# Patient Record
Sex: Male | Born: 1955 | Race: White | Hispanic: No | Marital: Married | Smoking: Former smoker
Health system: Southern US, Community
[De-identification: ages and names within clinical notes are randomized; demographics above are authoritative.]

## PROBLEM LIST (undated history)

## (undated) DIAGNOSIS — I4891 Unspecified atrial fibrillation: Secondary | ICD-10-CM

## (undated) HISTORY — PX: APPENDECTOMY: SHX54

## (undated) HISTORY — PX: CHOLECYSTECTOMY: SHX55

---

## 2019-02-10 ENCOUNTER — Encounter (HOSPITAL_BASED_OUTPATIENT_CLINIC_OR_DEPARTMENT_OTHER): Payer: Self-pay | Admitting: *Deleted

## 2019-02-10 ENCOUNTER — Emergency Department (HOSPITAL_BASED_OUTPATIENT_CLINIC_OR_DEPARTMENT_OTHER): Payer: 59

## 2019-02-10 ENCOUNTER — Emergency Department (HOSPITAL_BASED_OUTPATIENT_CLINIC_OR_DEPARTMENT_OTHER)
Admission: EM | Admit: 2019-02-10 | Discharge: 2019-02-10 | Disposition: A | Discharge status: Home / Self Care | Payer: 59 | Attending: Emergency Medicine | Admitting: Emergency Medicine

## 2019-02-10 ENCOUNTER — Other Ambulatory Visit: Payer: Self-pay

## 2019-02-10 DIAGNOSIS — Z87891 Personal history of nicotine dependence: Secondary | ICD-10-CM | POA: Diagnosis not present

## 2019-02-10 DIAGNOSIS — N201 Calculus of ureter: Secondary | ICD-10-CM | POA: Insufficient documentation

## 2019-02-10 DIAGNOSIS — Z7982 Long term (current) use of aspirin: Secondary | ICD-10-CM | POA: Diagnosis not present

## 2019-02-10 DIAGNOSIS — Z79899 Other long term (current) drug therapy: Secondary | ICD-10-CM | POA: Insufficient documentation

## 2019-02-10 DIAGNOSIS — R1030 Lower abdominal pain, unspecified: Secondary | ICD-10-CM | POA: Diagnosis present

## 2019-02-10 HISTORY — DX: Unspecified atrial fibrillation: I48.91

## 2019-02-10 LAB — LIPASE, BLOOD: Lipase: 28 U/L (ref 11–51)

## 2019-02-10 LAB — CBC WITH DIFFERENTIAL/PLATELET
Abs Immature Granulocytes: 0.03 10*3/uL (ref 0.00–0.07)
Basophils Absolute: 0 10*3/uL (ref 0.0–0.1)
Basophils Relative: 0 %
Eosinophils Absolute: 0 10*3/uL (ref 0.0–0.5)
Eosinophils Relative: 0 %
HCT: 45.2 % (ref 39.0–52.0)
Hemoglobin: 15 g/dL (ref 13.0–17.0)
Immature Granulocytes: 0 %
Lymphocytes Relative: 9 %
Lymphs Abs: 1 10*3/uL (ref 0.7–4.0)
MCH: 30.4 pg (ref 26.0–34.0)
MCHC: 33.2 g/dL (ref 30.0–36.0)
MCV: 91.7 fL (ref 80.0–100.0)
Monocytes Absolute: 0.4 10*3/uL (ref 0.1–1.0)
Monocytes Relative: 3 %
Neutro Abs: 9.5 10*3/uL — ABNORMAL HIGH (ref 1.7–7.7)
Neutrophils Relative %: 88 %
Platelets: 218 10*3/uL (ref 150–400)
RBC: 4.93 MIL/uL (ref 4.22–5.81)
RDW: 12.3 % (ref 11.5–15.5)
WBC: 10.9 10*3/uL — ABNORMAL HIGH (ref 4.0–10.5)
nRBC: 0 % (ref 0.0–0.2)

## 2019-02-10 LAB — COMPREHENSIVE METABOLIC PANEL
ALT: 29 U/L (ref 0–44)
AST: 26 U/L (ref 15–41)
Albumin: 4.5 g/dL (ref 3.5–5.0)
Alkaline Phosphatase: 71 U/L (ref 38–126)
Anion gap: 9 (ref 5–15)
BUN: 25 mg/dL — ABNORMAL HIGH (ref 8–23)
CO2: 25 mmol/L (ref 22–32)
Calcium: 11.3 mg/dL — ABNORMAL HIGH (ref 8.9–10.3)
Chloride: 104 mmol/L (ref 98–111)
Creatinine, Ser: 1.1 mg/dL (ref 0.61–1.24)
GFR calc Af Amer: >60 mL/min (ref >60)
GFR calc non Af Amer: >60 mL/min (ref >60)
Glucose, Bld: 110 mg/dL — ABNORMAL HIGH (ref 70–99)
Potassium: 4.4 mmol/L (ref 3.5–5.1)
Sodium: 138 mmol/L (ref 135–145)
Total Bilirubin: 1 mg/dL (ref 0.3–1.2)
Total Protein: 7.5 g/dL (ref 6.5–8.1)

## 2019-02-10 LAB — URINALYSIS, MICROSCOPIC (REFLEX): RBC / HPF: >50 RBC/hpf (ref 0–5)

## 2019-02-10 LAB — URINALYSIS, ROUTINE W REFLEX MICROSCOPIC
Bilirubin Urine: NEGATIVE
Glucose, UA: NEGATIVE mg/dL
Ketones, ur: NEGATIVE mg/dL
Nitrite: NEGATIVE
Protein, ur: NEGATIVE mg/dL
Specific Gravity, Urine: 1.025 (ref 1.005–1.030)
pH: 6 (ref 5.0–8.0)

## 2019-02-10 MED ORDER — SODIUM CHLORIDE 0.9 % IV BOLUS
1000.0000 mL | Freq: Once | INTRAVENOUS | Status: AC
  Administered 2019-02-10: 1000 mL via INTRAVENOUS

## 2019-02-10 MED ORDER — TAMSULOSIN HCL 0.4 MG PO CAPS: 0.4000 mg | ORAL_CAPSULE | Freq: Every day | ORAL | 0 refills | Status: AC

## 2019-02-10 MED ORDER — IOHEXOL 300 MG/ML  SOLN
100.0000 mL | Freq: Once | INTRAMUSCULAR | Status: AC | PRN
  Administered 2019-02-10: 100 mL via INTRAVENOUS

## 2019-02-10 MED ORDER — ONDANSETRON 4 MG PO TBDP: 4.0000 mg | ORAL_TABLET | Freq: Three times a day (TID) | ORAL | 0 refills | Status: AC | PRN

## 2019-02-10 MED ORDER — HYDROCODONE-ACETAMINOPHEN 5-325 MG PO TABS: 1.0000 | ORAL_TABLET | ORAL | 0 refills | Status: AC | PRN

## 2019-02-10 MED ORDER — HYDROMORPHONE HCL 1 MG/ML IJ SOLN
1.0000 mg | Freq: Once | INTRAMUSCULAR | Status: AC
  Administered 2019-02-10: 1 mg via INTRAVENOUS
  Filled 2019-02-10: qty 1

## 2019-02-10 NOTE — ED Provider Notes (Signed)
[facility] Provider Note   CSN: [id number] Arrival date & time: 02/10/19  1547     History   Chief Complaint Chief Complaint  Patient presents with  . Abdominal Pain    HPI Joshua Carlson is a 63 y.o. male.     HPI 63 year old male presents with acute lower abdominal pain.  Started around 10 AM.  Progressively worsened.  At one point got to an 8 out of 10 and he took ibuprofen.  He also had a concomitant temperature of 99.6.  Had some vomiting and went to urgent care and was sent here to rule out diverticulitis.  Pain is in his left lower quadrant.  No back pain or urinary symptoms such as dysuria/hematuria. Pain is now a 3.  Past Medical History:  Diagnosis Date  . Atrial fibrillation ([place])     There are no active problems to display for this patient.   Past Surgical History:  Procedure Laterality Date  . APPENDECTOMY    . CHOLECYSTECTOMY          Home Medications    Prior to Admission medications   Medication Sig Start Date End Date Taking? Authorizing Provider  aspirin EC 81 MG tablet Take by mouth. 08/16/16  Yes [provider]  diltiazem (CARDIZEM CD) 120 MG 24 hr capsule TAKE 1 CAPSULE(120 MG) BY MOUTH DAILY 08/16/16  Yes [provider]  rosuvastatin (CRESTOR) 5 MG tablet Take by mouth. 12/03/18  Yes [provider]  HYDROcodone-acetaminophen (NORCO) 5-325 MG tablet Take 1 tablet by mouth every 4 (four) hours as needed for severe pain. 02/10/19   Sherwood Gambler, MD  ondansetron (ZOFRAN ODT) 4 MG disintegrating tablet Take 1 tablet (4 mg total) by mouth every 8 (eight) hours as needed. 02/10/19   Sherwood Gambler, MD  tamsulosin (FLOMAX) 0.4 MG CAPS capsule Take 1 capsule (0.4 mg total) by mouth daily. 02/10/19   Sherwood Gambler, MD    Family History No family history on file.  Social History Social History   Tobacco Use  . Smoking status: Former Research scientist (life sciences)  . Smokeless tobacco: Never Used   Substance Use Topics  . Alcohol use: Not Currently    Comment: rare  . Drug use: Never     Allergies   Patient has no known allergies.   Review of Systems Review of Systems  Constitutional: Negative for fever (low grade temp).  Gastrointestinal: Positive for abdominal pain and vomiting.  Genitourinary: Negative for dysuria and hematuria.  Musculoskeletal: Negative for back pain.  All other systems reviewed and are negative.    Physical Exam Updated Vital Signs BP (!) 141/88   Pulse 69   Temp 98.6 F (37 C) (Oral)   Ht 6' (1.829 m)   Wt 122.5 kg   SpO2 99%   BMI 36.62 kg/m   Physical Exam Vitals signs and nursing note reviewed.  Constitutional:      General: He is not in acute distress.    Appearance: He is well-developed. He is obese. He is not ill-appearing or diaphoretic.  HENT:     Head: Normocephalic and atraumatic.     Right Ear: External ear normal.     Left Ear: External ear normal.     Nose: Nose normal.  Eyes:     General:        Right eye: No discharge.        Left eye: No discharge.  Neck:     Musculoskeletal: Neck supple.  Cardiovascular:  Rate and Rhythm: Normal rate and regular rhythm.     Heart sounds: Normal heart sounds.  Pulmonary:     Effort: Pulmonary effort is normal.     Breath sounds: Normal breath sounds.  Abdominal:     Palpations: Abdomen is soft.     Tenderness: There is abdominal tenderness (mild) in the left lower quadrant.  Skin:    General: Skin is warm and dry.  Neurological:     Mental Status: He is alert.  Psychiatric:        Mood and Affect: Mood is not anxious.      ED Treatments / Results  Labs (all labs ordered are listed, but only abnormal results are displayed) Labs Reviewed  COMPREHENSIVE METABOLIC PANEL - Abnormal; Notable for the following components:      Result Value   Glucose, Bld 110 (*)    BUN 25 (*)    Calcium 11.3 (*)    All other components within normal limits  CBC WITH  DIFFERENTIAL/PLATELET - Abnormal; Notable for the following components:   WBC 10.9 (*)    Neutro Abs 9.5 (*)    All other components within normal limits  URINALYSIS, ROUTINE W REFLEX MICROSCOPIC - Abnormal; Notable for the following components:   APPearance CLOUDY (*)    Hgb urine dipstick LARGE (*)    Leukocytes,Ua TRACE (*)    All other components within normal limits  URINALYSIS, MICROSCOPIC (REFLEX) - Abnormal; Notable for the following components:   Bacteria, UA RARE (*)    All other components within normal limits  LIPASE, BLOOD    EKG None  Radiology Ct Abdomen Pelvis W Contrast  Result Date: 02/10/2019 CLINICAL DATA:  Left lower quadrant pain with several episodes of vomiting. EXAM: CT ABDOMEN AND PELVIS WITH CONTRAST TECHNIQUE: Multidetector CT imaging of the abdomen and pelvis was performed using the standard protocol following bolus administration of intravenous contrast. CONTRAST:  OMNIPAQUE IOHEXOL 300 MG/ML  SOLN COMPARISON:  11/13/2008 abdominal ultrasound report. 11/09/2006 abdominopelvic CT report. FINDINGS: Lower chest: Clear lung bases. Normal heart size without pericardial or pleural effusion. Hepatobiliary: Normal liver. Cholecystectomy, without biliary ductal dilatation. Pancreas: Normal, without mass or ductal dilatation. Spleen: Normal in size, without focal abnormality. Adrenals/Urinary Tract: Normal adrenal glands. Right renal 3 mm lower pole collecting system stone. No left renal calculi. Interpolar right renal too small to characterize lesion. Moderate left-sided hydroureteronephrosis. Partially duplicated left renal collecting system. Left ureteric dilatation to the level of an upper pelvis 7 mm left ureteric stone on image 74/2 and coronal image 58. Left bladder base stones x2 including at up to 6 mm on 82/2. A smaller stone is identified adjacent this on coronal image 62. Stomach/Bowel: Normal stomach, without wall thickening. Normal colon and terminal  ileum. Normal small bowel. Vascular/Lymphatic: Aortic atherosclerosis. No abdominopelvic adenopathy. Reproductive: Mild prostatomegaly. Other: No significant free fluid. Musculoskeletal: No acute osseous abnormality. IMPRESSION: 1. Moderate left-sided urinary tract obstruction secondary to a mid to distal left ureteric 7 mm stone. 2. Two left-sided bladder base stones, presumably passed from the left ureter. 3. Right nephrolithiasis. 4. Prostatomegaly. 5. Aortic Atherosclerosis (ICD10-I70.0). Electronically Signed   By: Jeronimo Greaves M.D.   On: 02/10/2019 17:53    Procedures Procedures (including critical care time)  Medications Ordered in ED Medications  iohexol (OMNIPAQUE) 300 MG/ML solution 100 mL (100 mLs Intravenous Contrast Given 02/10/19 1709)  sodium chloride 0.9 % bolus 1,000 mL (0 mLs Intravenous Stopped 02/10/19 1816)  HYDROmorphone (DILAUDID) injection 1  mg (1 mg Intravenous Given 02/10/19 1728)     Initial Impression / Assessment and Plan / ED Course  I have reviewed the triage vital signs and the nursing notes.  Pertinent labs & imaging results that were available during my care of the patient were reviewed by me and considered in my medical decision making (see chart for details).        CT shows left ureteral stone.  His pain did seem to get a little worse so he was given IV Dilaudid with good relief.  Now is pain-free.  Labs do show some mild hypercalcemia which likely is contributing to stones.  He was given IV fluids and encouraged to increase fluid intake at home.  Follow-up with PCP for further hypercalcemia work-up.  Otherwise, follow-up with urology for ureteral stone and will be discharged with Flomax and hydrocodone.  We discussed return precautions.  No signs of infection.  Final Clinical Impressions(s) / ED Diagnoses   Final diagnoses:  Left ureteral stone  Hypercalcemia    ED Discharge Orders         Ordered    HYDROcodone-acetaminophen (NORCO) 5-325 MG  tablet  Every 4 hours PRN     02/10/19 1811    ondansetron (ZOFRAN ODT) 4 MG disintegrating tablet  Every 8 hours PRN     02/10/19 1811    tamsulosin (FLOMAX) 0.4 MG CAPS capsule  Daily     02/10/19 1811           Pricilla LovelessGoldston, Zyanne Schumm, MD 02/10/19 1819

## 2019-02-10 NOTE — ED Triage Notes (Addendum)
Pt reports LLQ pain today with several episodes of vomiting. Seen at [facility] clinic and sent here for further eval. He took ibuprofen with some relief. States temp was 99.6 prior to taking ibuprofen

## 2019-02-10 NOTE — Discharge Instructions (Signed)
Be sure to drink plenty of fluids.  You may use ibuprofen for pain and then use the hydrocodone for breakthrough pain.  If you develop fever, vomiting, intractable pain, or any other new/concerning symptoms then return to the ER for evaluation.

## 2020-02-20 IMAGING — CT CT ABD-PELV W/ CM
2 of 5 series · 16 of 46 positions shown, 18 images · IV contrast (APPLIED)
Comparison: 11/13/2008 abdominal ultrasound report. 11/09/2006
abdominopelvic CT report.

CLINICAL DATA: Left lower quadrant pain with several episodes of
vomiting.

EXAM:
CT ABDOMEN AND PELVIS WITH CONTRAST
TECHNIQUE: Multidetector CT imaging of the abdomen and pelvis was performed
using the standard protocol following bolus administration of
intravenous contrast.
CONTRAST:  100mL OMNIPAQUE IOHEXOL 300 MG/ML  SOLN

[Series 2: axial st · axial · 0.98mm/px · z∈[-514,-58]mm · 13 of 103 slices shown, 15 images]
[im 6/103  soft-tissue]
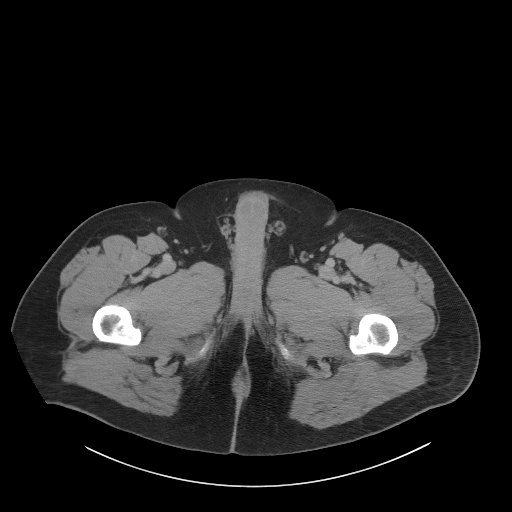
[im 6/103  bone]
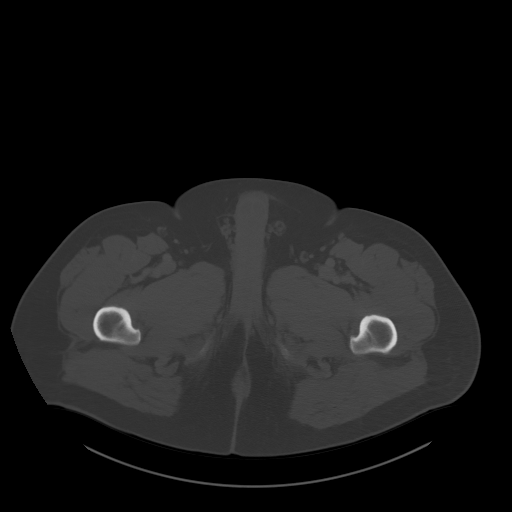
[im 16/103  soft-tissue]
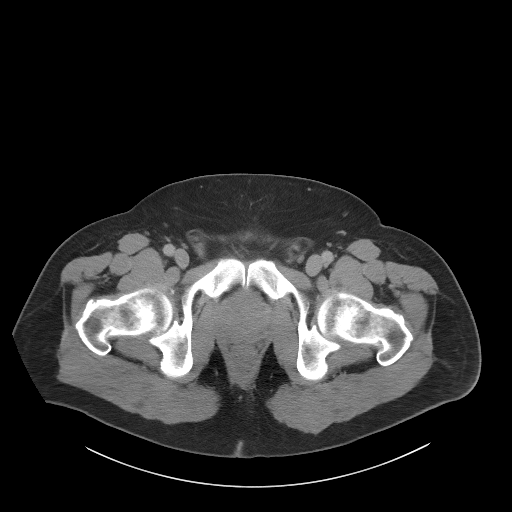
[im 21/103  soft-tissue]
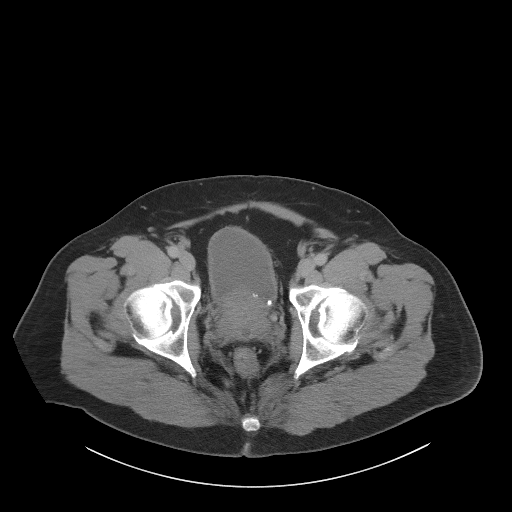
[im 31/103  soft-tissue]
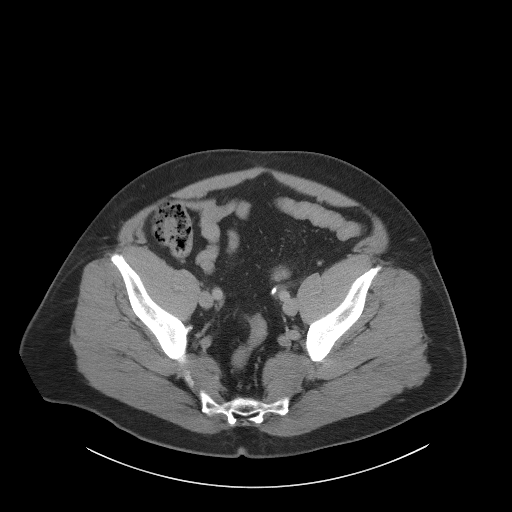
[im 36/103  soft-tissue]
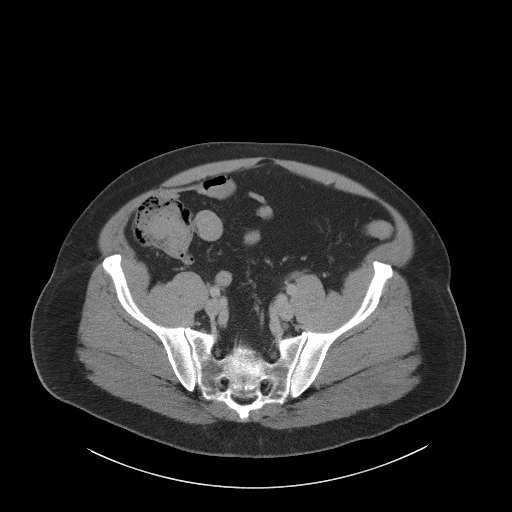
[im 46/103  soft-tissue]
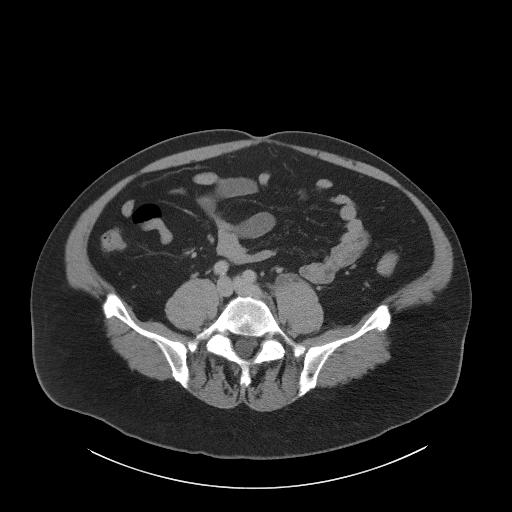
[im 52/103  soft-tissue]
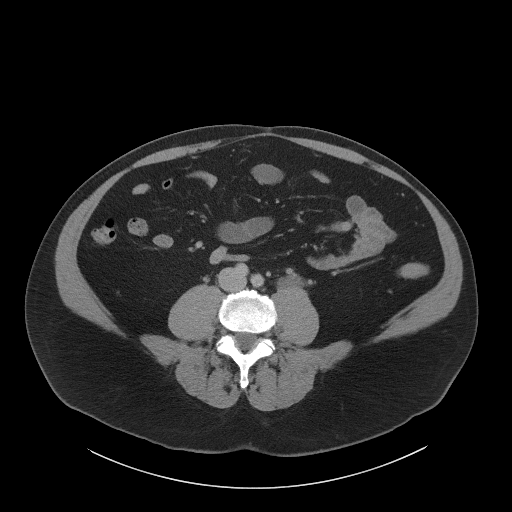
[im 57/103  soft-tissue]
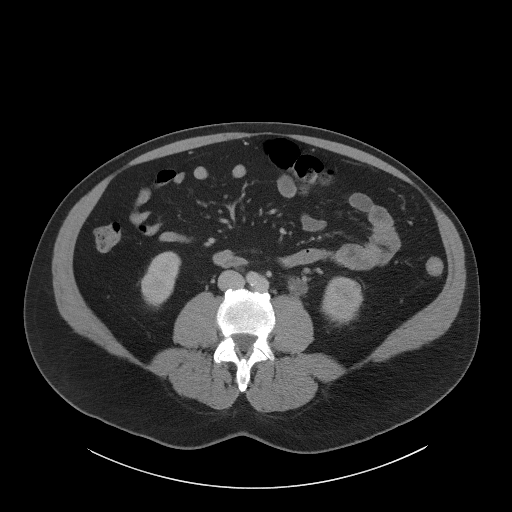
[im 67/103  soft-tissue]
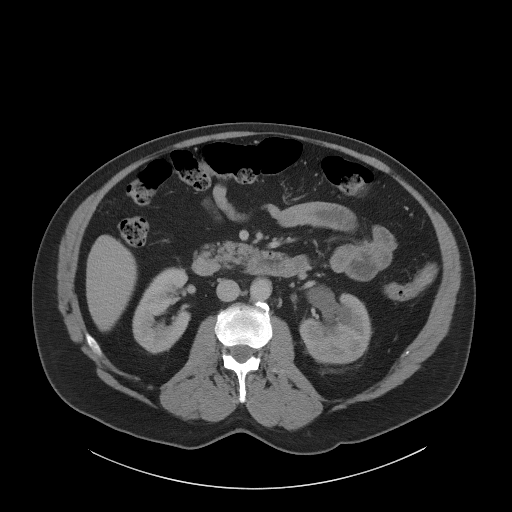
[im 67/103  bone]
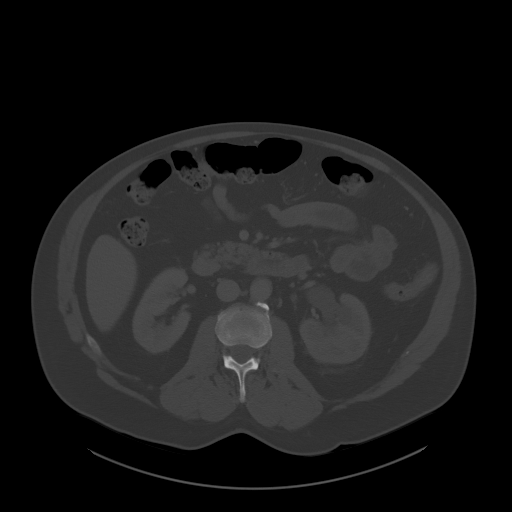
[im 72/103  soft-tissue]
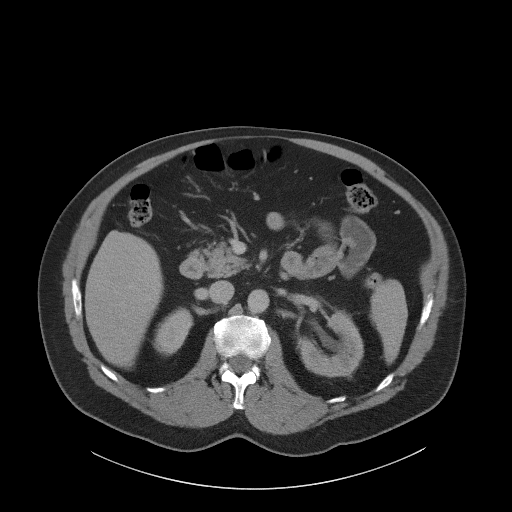
[im 82/103  soft-tissue]
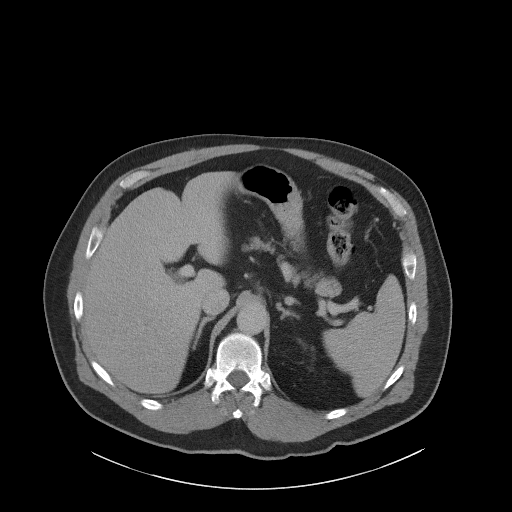
[im 87/103  soft-tissue]
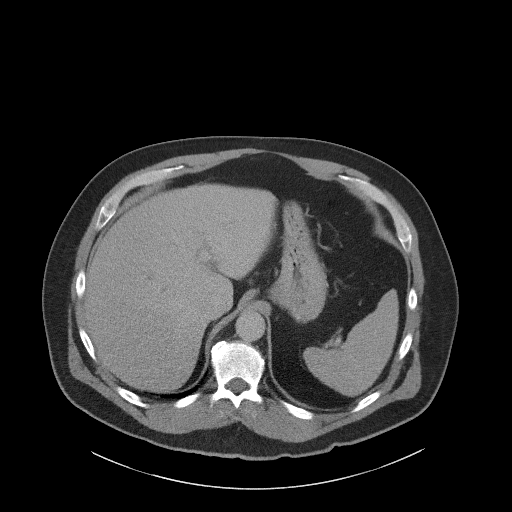
[im 97/103  soft-tissue]
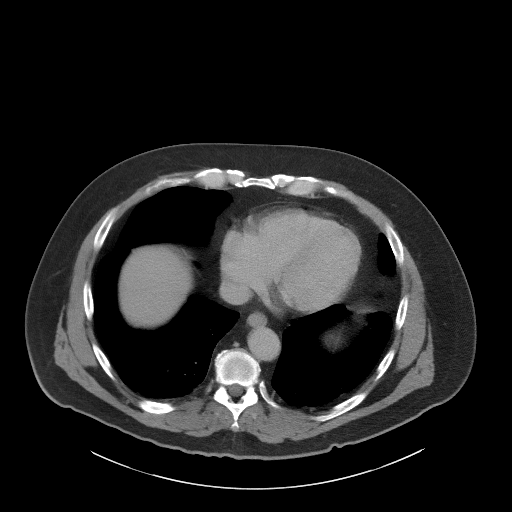

[Series 5: coronal st · coronal · 0.89mm/px · 3 of 109 slices shown]
[im 37/109  soft-tissue]
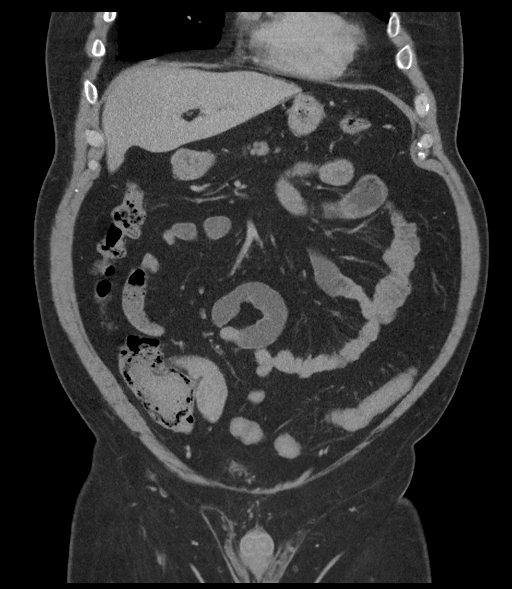
[im 49/109  soft-tissue]
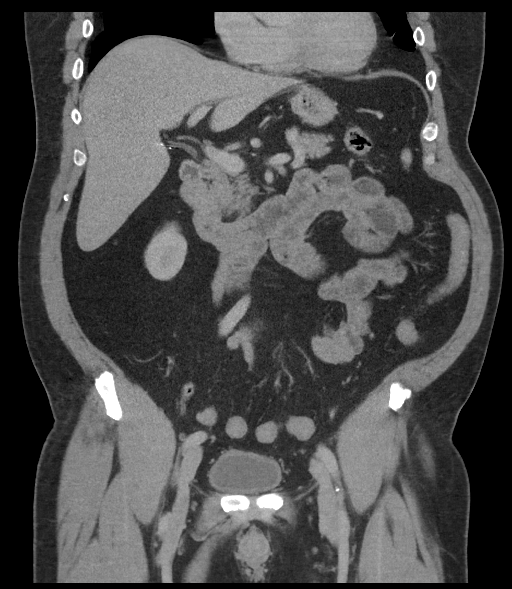
[im 61/109  soft-tissue]
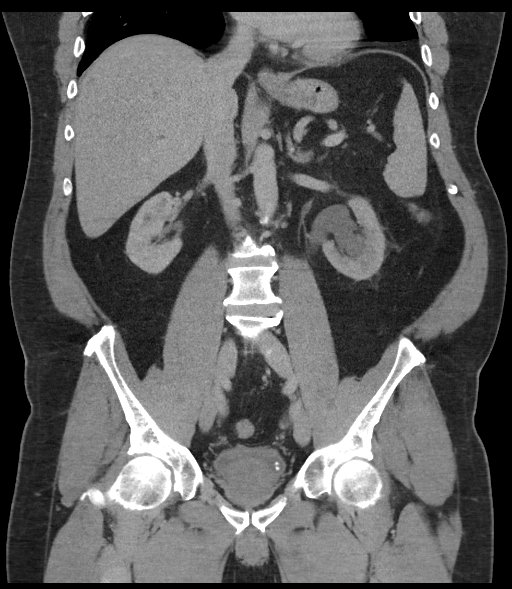

[16 of 46 positions shown; findings below may reference images not displayed]

FINDINGS: Lower chest: Clear lung bases. Normal heart size without pericardial
or pleural effusion.

Hepatobiliary: Normal liver. Cholecystectomy, without biliary ductal
dilatation.

Pancreas: Normal, without mass or ductal dilatation.

Spleen: Normal in size, without focal abnormality.

Adrenals/Urinary Tract: Normal adrenal glands. Right renal 3 mm
lower pole collecting system stone. No left renal calculi.
Interpolar right renal too small to characterize lesion. Moderate
left-sided hydroureteronephrosis. Partially duplicated left renal
collecting system. Left ureteric dilatation to the level of an upper
pelvis 7 mm left ureteric stone on image 74/2 and coronal image 58.

Left bladder base stones x2 including at up to 6 mm on 82/2. A
smaller stone is identified adjacent this on coronal image 62.

Stomach/Bowel: Normal stomach, without wall thickening. Normal colon
and terminal ileum. Normal small bowel.

Vascular/Lymphatic: Aortic atherosclerosis. No abdominopelvic
adenopathy.

Reproductive: Mild prostatomegaly.

Other: No significant free fluid.

Musculoskeletal: No acute osseous abnormality.
IMPRESSION: 1. Moderate left-sided urinary tract obstruction secondary to a mid
to distal left ureteric 7 mm stone.
2. Two left-sided bladder base stones, presumably passed from the
left ureter.
3. Right nephrolithiasis.
4. Prostatomegaly.
5. Aortic Atherosclerosis ([id number]).
